# Patient Record
Sex: Male | Born: 1965
Health system: Southern US, Community
[De-identification: ages and names within clinical notes are randomized; demographics above are authoritative.]

---

## 2005-04-02 ENCOUNTER — Ambulatory Visit: Payer: Self-pay | Admitting: Endocrinology

## 2013-11-13 ENCOUNTER — Telehealth: Payer: Self-pay | Admitting: Internal Medicine

## 2013-11-13 NOTE — Telephone Encounter (Signed)
Rec'd from [facility] forward 3 pages to Dr. Plotnikov °

## 2019-07-17 DIAGNOSIS — M1711 Unilateral primary osteoarthritis, right knee: Secondary | ICD-10-CM | POA: Diagnosis not present

## 2019-07-24 DIAGNOSIS — M1711 Unilateral primary osteoarthritis, right knee: Secondary | ICD-10-CM | POA: Diagnosis not present

## 2019-07-31 DIAGNOSIS — M1711 Unilateral primary osteoarthritis, right knee: Secondary | ICD-10-CM | POA: Diagnosis not present

## 2019-08-07 DIAGNOSIS — R519 Headache, unspecified: Secondary | ICD-10-CM | POA: Diagnosis not present

## 2019-08-07 DIAGNOSIS — U071 COVID-19: Secondary | ICD-10-CM | POA: Diagnosis not present

## 2019-08-12 DIAGNOSIS — J80 Acute respiratory distress syndrome: Secondary | ICD-10-CM | POA: Diagnosis not present

## 2019-08-12 DIAGNOSIS — I4891 Unspecified atrial fibrillation: Secondary | ICD-10-CM | POA: Diagnosis not present

## 2019-08-12 DIAGNOSIS — U071 COVID-19: Secondary | ICD-10-CM | POA: Diagnosis not present

## 2019-08-12 DIAGNOSIS — R55 Syncope and collapse: Secondary | ICD-10-CM | POA: Diagnosis not present

## 2019-08-12 DIAGNOSIS — R739 Hyperglycemia, unspecified: Secondary | ICD-10-CM | POA: Diagnosis not present

## 2019-08-12 DIAGNOSIS — K5900 Constipation, unspecified: Secondary | ICD-10-CM | POA: Diagnosis not present

## 2019-08-12 DIAGNOSIS — R918 Other nonspecific abnormal finding of lung field: Secondary | ICD-10-CM | POA: Diagnosis not present

## 2019-08-12 DIAGNOSIS — I10 Essential (primary) hypertension: Secondary | ICD-10-CM | POA: Diagnosis not present

## 2019-08-12 DIAGNOSIS — R0602 Shortness of breath: Secondary | ICD-10-CM | POA: Diagnosis not present

## 2019-08-12 DIAGNOSIS — R0603 Acute respiratory distress: Secondary | ICD-10-CM | POA: Diagnosis not present

## 2019-08-12 DIAGNOSIS — E871 Hypo-osmolality and hyponatremia: Secondary | ICD-10-CM | POA: Diagnosis not present

## 2019-08-12 DIAGNOSIS — E878 Other disorders of electrolyte and fluid balance, not elsewhere classified: Secondary | ICD-10-CM | POA: Diagnosis not present

## 2019-08-12 DIAGNOSIS — G4733 Obstructive sleep apnea (adult) (pediatric): Secondary | ICD-10-CM | POA: Diagnosis not present

## 2019-08-12 DIAGNOSIS — M1990 Unspecified osteoarthritis, unspecified site: Secondary | ICD-10-CM | POA: Diagnosis not present

## 2019-08-12 DIAGNOSIS — Z6841 Body Mass Index (BMI) 40.0 and over, adult: Secondary | ICD-10-CM | POA: Diagnosis not present

## 2019-08-12 DIAGNOSIS — J9601 Acute respiratory failure with hypoxia: Secondary | ICD-10-CM | POA: Diagnosis not present

## 2019-08-12 DIAGNOSIS — I517 Cardiomegaly: Secondary | ICD-10-CM | POA: Diagnosis not present

## 2019-08-12 DIAGNOSIS — J1282 Pneumonia due to coronavirus disease 2019: Secondary | ICD-10-CM | POA: Diagnosis not present

## 2019-08-12 DIAGNOSIS — M6281 Muscle weakness (generalized): Secondary | ICD-10-CM | POA: Diagnosis not present

## 2019-08-12 DIAGNOSIS — J189 Pneumonia, unspecified organism: Secondary | ICD-10-CM | POA: Diagnosis not present

## 2019-08-13 DIAGNOSIS — J9601 Acute respiratory failure with hypoxia: Secondary | ICD-10-CM | POA: Diagnosis not present

## 2019-08-15 DIAGNOSIS — R0603 Acute respiratory distress: Secondary | ICD-10-CM | POA: Diagnosis not present

## 2019-08-15 DIAGNOSIS — R918 Other nonspecific abnormal finding of lung field: Secondary | ICD-10-CM | POA: Diagnosis not present

## 2019-08-15 DIAGNOSIS — J9601 Acute respiratory failure with hypoxia: Secondary | ICD-10-CM | POA: Diagnosis not present

## 2019-08-20 DIAGNOSIS — I517 Cardiomegaly: Secondary | ICD-10-CM | POA: Diagnosis not present

## 2019-08-20 DIAGNOSIS — J189 Pneumonia, unspecified organism: Secondary | ICD-10-CM | POA: Diagnosis not present

## 2019-08-20 DIAGNOSIS — J9601 Acute respiratory failure with hypoxia: Secondary | ICD-10-CM | POA: Diagnosis not present

## 2019-08-22 DIAGNOSIS — J9601 Acute respiratory failure with hypoxia: Secondary | ICD-10-CM | POA: Diagnosis not present

## 2019-08-22 DIAGNOSIS — U071 COVID-19: Secondary | ICD-10-CM | POA: Diagnosis not present

## 2019-08-22 DIAGNOSIS — J1282 Pneumonia due to coronavirus disease 2019: Secondary | ICD-10-CM | POA: Diagnosis not present

## 2019-08-23 DIAGNOSIS — J9601 Acute respiratory failure with hypoxia: Secondary | ICD-10-CM | POA: Diagnosis not present

## 2019-08-24 DIAGNOSIS — J9601 Acute respiratory failure with hypoxia: Secondary | ICD-10-CM | POA: Diagnosis not present

## 2019-08-24 DIAGNOSIS — K5900 Constipation, unspecified: Secondary | ICD-10-CM | POA: Diagnosis not present

## 2019-08-25 DIAGNOSIS — J9601 Acute respiratory failure with hypoxia: Secondary | ICD-10-CM | POA: Diagnosis not present

## 2019-08-26 DIAGNOSIS — J9601 Acute respiratory failure with hypoxia: Secondary | ICD-10-CM | POA: Diagnosis not present

## 2019-08-27 DIAGNOSIS — J9601 Acute respiratory failure with hypoxia: Secondary | ICD-10-CM | POA: Diagnosis not present

## 2019-08-28 DIAGNOSIS — J9601 Acute respiratory failure with hypoxia: Secondary | ICD-10-CM | POA: Diagnosis not present

## 2019-08-30 DIAGNOSIS — J9601 Acute respiratory failure with hypoxia: Secondary | ICD-10-CM | POA: Diagnosis not present

## 2019-09-01 DIAGNOSIS — M1990 Unspecified osteoarthritis, unspecified site: Secondary | ICD-10-CM | POA: Diagnosis not present

## 2019-09-01 DIAGNOSIS — M6281 Muscle weakness (generalized): Secondary | ICD-10-CM | POA: Diagnosis not present

## 2019-09-01 DIAGNOSIS — I10 Essential (primary) hypertension: Secondary | ICD-10-CM | POA: Diagnosis not present

## 2019-09-01 DIAGNOSIS — Z8616 Personal history of COVID-19: Secondary | ICD-10-CM | POA: Diagnosis not present

## 2019-09-01 DIAGNOSIS — U071 COVID-19: Secondary | ICD-10-CM | POA: Diagnosis not present

## 2019-09-01 DIAGNOSIS — J9601 Acute respiratory failure with hypoxia: Secondary | ICD-10-CM | POA: Diagnosis not present

## 2019-09-01 DIAGNOSIS — R0602 Shortness of breath: Secondary | ICD-10-CM | POA: Diagnosis not present

## 2019-09-01 DIAGNOSIS — E871 Hypo-osmolality and hyponatremia: Secondary | ICD-10-CM | POA: Diagnosis not present

## 2019-09-05 DIAGNOSIS — M1990 Unspecified osteoarthritis, unspecified site: Secondary | ICD-10-CM | POA: Diagnosis not present

## 2019-09-05 DIAGNOSIS — I10 Essential (primary) hypertension: Secondary | ICD-10-CM | POA: Diagnosis not present

## 2019-09-05 DIAGNOSIS — M6281 Muscle weakness (generalized): Secondary | ICD-10-CM | POA: Diagnosis not present

## 2019-09-06 DIAGNOSIS — I10 Essential (primary) hypertension: Secondary | ICD-10-CM | POA: Diagnosis not present

## 2019-09-06 DIAGNOSIS — M1990 Unspecified osteoarthritis, unspecified site: Secondary | ICD-10-CM | POA: Diagnosis not present

## 2019-09-12 DIAGNOSIS — J1282 Pneumonia due to coronavirus disease 2019: Secondary | ICD-10-CM | POA: Diagnosis not present

## 2019-09-12 DIAGNOSIS — J9601 Acute respiratory failure with hypoxia: Secondary | ICD-10-CM | POA: Diagnosis not present

## 2019-09-12 DIAGNOSIS — I4891 Unspecified atrial fibrillation: Secondary | ICD-10-CM | POA: Diagnosis not present

## 2019-09-12 DIAGNOSIS — U071 COVID-19: Secondary | ICD-10-CM | POA: Diagnosis not present

## 2019-09-12 DIAGNOSIS — Z6841 Body Mass Index (BMI) 40.0 and over, adult: Secondary | ICD-10-CM | POA: Diagnosis not present

## 2019-09-12 DIAGNOSIS — E871 Hypo-osmolality and hyponatremia: Secondary | ICD-10-CM | POA: Diagnosis not present

## 2019-09-13 DIAGNOSIS — E871 Hypo-osmolality and hyponatremia: Secondary | ICD-10-CM | POA: Diagnosis not present

## 2019-09-13 DIAGNOSIS — J9601 Acute respiratory failure with hypoxia: Secondary | ICD-10-CM | POA: Diagnosis not present

## 2019-09-13 DIAGNOSIS — U071 COVID-19: Secondary | ICD-10-CM | POA: Diagnosis not present

## 2019-09-13 DIAGNOSIS — J1282 Pneumonia due to coronavirus disease 2019: Secondary | ICD-10-CM | POA: Diagnosis not present

## 2019-09-13 DIAGNOSIS — Z6841 Body Mass Index (BMI) 40.0 and over, adult: Secondary | ICD-10-CM | POA: Diagnosis not present

## 2019-09-13 DIAGNOSIS — I4891 Unspecified atrial fibrillation: Secondary | ICD-10-CM | POA: Diagnosis not present

## 2019-09-19 DIAGNOSIS — J9601 Acute respiratory failure with hypoxia: Secondary | ICD-10-CM | POA: Diagnosis not present

## 2019-09-19 DIAGNOSIS — E871 Hypo-osmolality and hyponatremia: Secondary | ICD-10-CM | POA: Diagnosis not present

## 2019-09-19 DIAGNOSIS — U071 COVID-19: Secondary | ICD-10-CM | POA: Diagnosis not present

## 2019-09-19 DIAGNOSIS — J1282 Pneumonia due to coronavirus disease 2019: Secondary | ICD-10-CM | POA: Diagnosis not present

## 2019-09-19 DIAGNOSIS — Z6841 Body Mass Index (BMI) 40.0 and over, adult: Secondary | ICD-10-CM | POA: Diagnosis not present

## 2019-09-19 DIAGNOSIS — I4891 Unspecified atrial fibrillation: Secondary | ICD-10-CM | POA: Diagnosis not present

## 2019-09-20 DIAGNOSIS — E871 Hypo-osmolality and hyponatremia: Secondary | ICD-10-CM | POA: Diagnosis not present

## 2019-09-20 DIAGNOSIS — Z6841 Body Mass Index (BMI) 40.0 and over, adult: Secondary | ICD-10-CM | POA: Diagnosis not present

## 2019-09-20 DIAGNOSIS — U071 COVID-19: Secondary | ICD-10-CM | POA: Diagnosis not present

## 2019-09-20 DIAGNOSIS — I4891 Unspecified atrial fibrillation: Secondary | ICD-10-CM | POA: Diagnosis not present

## 2019-09-20 DIAGNOSIS — J9601 Acute respiratory failure with hypoxia: Secondary | ICD-10-CM | POA: Diagnosis not present

## 2019-09-20 DIAGNOSIS — J1282 Pneumonia due to coronavirus disease 2019: Secondary | ICD-10-CM | POA: Diagnosis not present

## 2019-09-24 DIAGNOSIS — I4891 Unspecified atrial fibrillation: Secondary | ICD-10-CM | POA: Diagnosis not present

## 2019-09-24 DIAGNOSIS — Z6841 Body Mass Index (BMI) 40.0 and over, adult: Secondary | ICD-10-CM | POA: Diagnosis not present

## 2019-09-24 DIAGNOSIS — J9601 Acute respiratory failure with hypoxia: Secondary | ICD-10-CM | POA: Diagnosis not present

## 2019-09-24 DIAGNOSIS — E871 Hypo-osmolality and hyponatremia: Secondary | ICD-10-CM | POA: Diagnosis not present

## 2019-09-24 DIAGNOSIS — U071 COVID-19: Secondary | ICD-10-CM | POA: Diagnosis not present

## 2019-09-24 DIAGNOSIS — J1282 Pneumonia due to coronavirus disease 2019: Secondary | ICD-10-CM | POA: Diagnosis not present

## 2019-10-02 DIAGNOSIS — Z8616 Personal history of COVID-19: Secondary | ICD-10-CM | POA: Diagnosis not present

## 2019-10-02 DIAGNOSIS — R0602 Shortness of breath: Secondary | ICD-10-CM | POA: Diagnosis not present

## 2019-10-02 DIAGNOSIS — U071 COVID-19: Secondary | ICD-10-CM | POA: Diagnosis not present

## 2019-10-03 DIAGNOSIS — J9601 Acute respiratory failure with hypoxia: Secondary | ICD-10-CM | POA: Diagnosis not present

## 2019-10-03 DIAGNOSIS — Z6841 Body Mass Index (BMI) 40.0 and over, adult: Secondary | ICD-10-CM | POA: Diagnosis not present

## 2019-10-03 DIAGNOSIS — I4891 Unspecified atrial fibrillation: Secondary | ICD-10-CM | POA: Diagnosis not present

## 2019-10-03 DIAGNOSIS — U071 COVID-19: Secondary | ICD-10-CM | POA: Diagnosis not present

## 2019-10-03 DIAGNOSIS — E871 Hypo-osmolality and hyponatremia: Secondary | ICD-10-CM | POA: Diagnosis not present

## 2019-10-03 DIAGNOSIS — J1282 Pneumonia due to coronavirus disease 2019: Secondary | ICD-10-CM | POA: Diagnosis not present

## 2019-10-05 DIAGNOSIS — J9601 Acute respiratory failure with hypoxia: Secondary | ICD-10-CM | POA: Diagnosis not present

## 2019-10-05 DIAGNOSIS — J1282 Pneumonia due to coronavirus disease 2019: Secondary | ICD-10-CM | POA: Diagnosis not present

## 2019-10-05 DIAGNOSIS — E871 Hypo-osmolality and hyponatremia: Secondary | ICD-10-CM | POA: Diagnosis not present

## 2019-10-05 DIAGNOSIS — U071 COVID-19: Secondary | ICD-10-CM | POA: Diagnosis not present

## 2019-10-05 DIAGNOSIS — I4891 Unspecified atrial fibrillation: Secondary | ICD-10-CM | POA: Diagnosis not present

## 2019-10-05 DIAGNOSIS — Z6841 Body Mass Index (BMI) 40.0 and over, adult: Secondary | ICD-10-CM | POA: Diagnosis not present

## 2019-10-11 DIAGNOSIS — I4891 Unspecified atrial fibrillation: Secondary | ICD-10-CM | POA: Diagnosis not present

## 2019-10-11 DIAGNOSIS — J9601 Acute respiratory failure with hypoxia: Secondary | ICD-10-CM | POA: Diagnosis not present

## 2019-10-11 DIAGNOSIS — Z6841 Body Mass Index (BMI) 40.0 and over, adult: Secondary | ICD-10-CM | POA: Diagnosis not present

## 2019-10-11 DIAGNOSIS — U071 COVID-19: Secondary | ICD-10-CM | POA: Diagnosis not present

## 2019-10-11 DIAGNOSIS — J1282 Pneumonia due to coronavirus disease 2019: Secondary | ICD-10-CM | POA: Diagnosis not present

## 2019-10-11 DIAGNOSIS — E871 Hypo-osmolality and hyponatremia: Secondary | ICD-10-CM | POA: Diagnosis not present

## 2019-10-17 DIAGNOSIS — M1990 Unspecified osteoarthritis, unspecified site: Secondary | ICD-10-CM | POA: Diagnosis not present

## 2019-10-17 DIAGNOSIS — G4733 Obstructive sleep apnea (adult) (pediatric): Secondary | ICD-10-CM | POA: Diagnosis not present

## 2019-10-17 DIAGNOSIS — M6281 Muscle weakness (generalized): Secondary | ICD-10-CM | POA: Diagnosis not present

## 2019-10-17 DIAGNOSIS — Z9981 Dependence on supplemental oxygen: Secondary | ICD-10-CM | POA: Diagnosis not present

## 2019-10-19 DIAGNOSIS — G4730 Sleep apnea, unspecified: Secondary | ICD-10-CM | POA: Diagnosis not present

## 2019-10-23 DIAGNOSIS — I340 Nonrheumatic mitral (valve) insufficiency: Secondary | ICD-10-CM | POA: Diagnosis not present

## 2019-10-23 DIAGNOSIS — I517 Cardiomegaly: Secondary | ICD-10-CM | POA: Diagnosis not present

## 2019-10-23 DIAGNOSIS — U071 COVID-19: Secondary | ICD-10-CM | POA: Diagnosis not present

## 2019-10-23 DIAGNOSIS — J1282 Pneumonia due to coronavirus disease 2019: Secondary | ICD-10-CM | POA: Diagnosis not present

## 2019-10-24 ENCOUNTER — Ambulatory Visit: Payer: BC Managed Care – PPO | Admitting: Pulmonary Disease

## 2019-10-24 ENCOUNTER — Ambulatory Visit (INDEPENDENT_AMBULATORY_CARE_PROVIDER_SITE_OTHER)
Admission: RE | Admit: 2019-10-24 | Discharge: 2019-10-24 | Disposition: A | Discharge status: Home / Self Care | Payer: BC Managed Care – PPO | Source: Ambulatory Visit | Attending: Pulmonary Disease | Admitting: Pulmonary Disease

## 2019-10-24 ENCOUNTER — Ambulatory Visit: Payer: BC Managed Care – PPO

## 2019-10-24 ENCOUNTER — Other Ambulatory Visit: Payer: Self-pay

## 2019-10-24 ENCOUNTER — Encounter: Payer: Self-pay | Admitting: Pulmonary Disease

## 2019-10-24 VITALS — BP 124/68 | HR 86 | Temp 97.1°F | Ht 73.0 in | Wt 377.8 lb

## 2019-10-24 DIAGNOSIS — R0602 Shortness of breath: Secondary | ICD-10-CM | POA: Diagnosis not present

## 2019-10-24 DIAGNOSIS — R0600 Dyspnea, unspecified: Secondary | ICD-10-CM | POA: Diagnosis not present

## 2019-10-24 DIAGNOSIS — R0609 Other forms of dyspnea: Secondary | ICD-10-CM

## 2019-10-24 NOTE — Progress Notes (Signed)
Travis Stevenson    [id number]    Nov 01, 1965  Primary Care Physician:Dang, Nyoka Lint, MD  Referring Physician: No referring provider defined for this encounter.  Chief complaint:   Patient was treated recently treated in the hospital for Covid pneumonia  HPI:  Recent treatment for Covid pneumonia was admitted February 14, spent 3 weeks in the hospital Was not intubated He was discharged on 3 L of oxygen currently down to 1 L Exercise tolerance has continued to improve Saturations usually about 9091% on room air at rest with activity he does use about 1 L with saturations in the low 90s  Denies any shortness of breath at rest Able to tolerate activities of daily living  He did not have any lung disease or heart disease prior to his diagnosis of Covid pneumonia  He remembers being told that he has extensive infiltrative process in his lungs even at the time of discharge  He does have a history of snoring Denies headaches denies any dryness of his mouth in the mornings No family history of obstructive sleep apnea As always.  Heavy  Denies any significant daytime sleepiness  Never smoker  Pets: Occupation: Exposures: Smoking history: Travel history: Relevant family history:  Outpatient Encounter Medications as of 10/24/2019  Medication Sig  . aspirin EC 81 MG tablet Take 81 mg by mouth daily.  . metoprolol tartrate (LOPRESSOR) 50 MG tablet Take 50 mg by mouth 2 (two) times daily.   No facility-administered encounter medications on file as of 10/24/2019.    Allergies as of 10/24/2019  . (No Known Allergies)    No past medical history on file.   No family history on file.  Social History   Socioeconomic History  . Marital status: Married    Spouse name: Not on file  . Number of children: Not on file  . Years of education: Not on file  . Highest education level: Not on file  Occupational History  . Not on file  Tobacco Use  . Smoking status: Never  Smoker  . Smokeless tobacco: Never Used  Substance and Sexual Activity  . Alcohol use: Not on file  . Drug use: Not on file  . Sexual activity: Not on file  Other Topics Concern  . Not on file  Social History Narrative  . Not on file   Social Determinants of Health   Financial Resource Strain:   . Difficulty of Paying Living Expenses:   Food Insecurity:   . Worried About Charity fundraiser in the Last Year:   . Arboriculturist in the Last Year:   Transportation Needs:   . Film/video editor (Medical):   Marland Kitchen Lack of Transportation (Non-Medical):   Physical Activity:   . Days of Exercise per Week:   . Minutes of Exercise per Session:   Stress:   . Feeling of Stress :   Social Connections:   . Frequency of Communication with Friends and Family:   . Frequency of Social Gatherings with Friends and Family:   . Attends Religious Services:   . Active Member of Clubs or Organizations:   . Attends Archivist Meetings:   Marland Kitchen Marital Status:   Intimate Partner Violence:   . Fear of Current or Ex-Partner:   . Emotionally Abused:   Marland Kitchen Physically Abused:   . Sexually Abused:     Review of Systems  Respiratory: Positive for shortness of breath. Negative for cough.  Psychiatric/Behavioral: Positive for sleep disturbance.    Vitals:   10/24/19 1021  BP: 124/68  Pulse: 86  Temp: (!) 97.1 F (36.2 C)  SpO2: 97%     Physical Exam  Constitutional: He is oriented to person, place, and time. He appears well-developed.  Obese  HENT:  Head: Normocephalic and atraumatic.  Eyes: Pupils are equal, round, and reactive to light. Right eye exhibits no discharge. Left eye exhibits no discharge.  Neck: No tracheal deviation present. No thyromegaly present.  Cardiovascular: Normal rate and regular rhythm.  Pulmonary/Chest: Effort normal and breath sounds normal. No respiratory distress. He has no wheezes. He has no rales. He exhibits no tenderness.  Musculoskeletal:         General: No edema. Normal range of motion.     Cervical back: Normal range of motion and neck supple.  Neurological: He is alert and oriented to person, place, and time.  Skin: Skin is warm and dry.  Psychiatric: He has a normal mood and affect.   Data Reviewed: CT on record at [facility] August 12, 2019 revealed extensive bilateral groundglass and consolidative airspace disease  Assessment:  Hypoxemic respiratory failure -Continue using oxygen supplementation Wean oxygen as tolerated  Graded exercises  Possible obstructive sleep apnea -Consider home sleep study when more stable  Plan/Recommendations: Obtain chest x-ray today  Wean oxygen as tolerated  Obtain pulmonary function testing  Consider sleep study  Weight loss efforts  We will follow-up with him in about 4 to 6 weeks   Virl Diamond MD [facility] 10/24/2019, 10:54 AM  CC: No ref. provider found

## 2019-10-24 NOTE — Patient Instructions (Signed)
Recent Covid pneumonia Hypoxic respiratory failure-need for oxygen  Continue graded activities and exercise Oxygen use as needed to keep saturations greater than 90%  We will get a chest x-ray today We will order a breathing study on you  We may get a CT scan of the chest in a few weeks down the line depending on what the x-ray shows  A sleep study once things improve a little bit better  Call with significant concerns

## 2019-11-01 DIAGNOSIS — U071 COVID-19: Secondary | ICD-10-CM | POA: Diagnosis not present

## 2019-11-06 ENCOUNTER — Telehealth: Payer: Self-pay | Admitting: Pulmonary Disease

## 2019-11-06 NOTE — Telephone Encounter (Signed)
Dr. Wynona Neat, please review chest xray.

## 2019-11-08 NOTE — Telephone Encounter (Signed)
Dr. Wynona Neat please advise on CXR

## 2019-11-16 ENCOUNTER — Other Ambulatory Visit (HOSPITAL_COMMUNITY)
Admission: RE | Admit: 2019-11-16 | Discharge: 2019-11-16 | Disposition: A | Discharge status: Home / Self Care | Payer: BC Managed Care – PPO | Source: Ambulatory Visit | Attending: Pulmonary Disease | Admitting: Pulmonary Disease

## 2019-11-16 DIAGNOSIS — Z01812 Encounter for preprocedural laboratory examination: Secondary | ICD-10-CM | POA: Insufficient documentation

## 2019-11-16 DIAGNOSIS — Z20822 Contact with and (suspected) exposure to covid-19: Secondary | ICD-10-CM | POA: Diagnosis not present

## 2019-11-16 LAB — SARS CORONAVIRUS 2 (TAT 6-24 HRS): SARS Coronavirus 2: NEGATIVE

## 2019-11-19 NOTE — Telephone Encounter (Signed)
Scarring in the right midlung-chronic change  No acute process

## 2019-11-20 ENCOUNTER — Other Ambulatory Visit: Payer: Self-pay

## 2019-11-20 ENCOUNTER — Ambulatory Visit (INDEPENDENT_AMBULATORY_CARE_PROVIDER_SITE_OTHER): Payer: BC Managed Care – PPO | Admitting: Pulmonary Disease

## 2019-11-20 DIAGNOSIS — R0600 Dyspnea, unspecified: Secondary | ICD-10-CM | POA: Diagnosis not present

## 2019-11-20 DIAGNOSIS — R0609 Other forms of dyspnea: Secondary | ICD-10-CM

## 2019-11-20 LAB — PULMONARY FUNCTION TEST
DL/VA % pred: 103 %
DL/VA: 4.48 ml/min/mmHg/L
DLCO cor % pred: 82 %
DLCO cor: 24.37 ml/min/mmHg
DLCO unc % pred: 82 %
DLCO unc: 24.37 ml/min/mmHg
FEF 25-75 Post: 5.07 L/sec
FEF 25-75 Pre: 3.81 L/sec
FEF2575-%Change-Post: 32 %
FEF2575-%Pred-Post: 152 %
FEF2575-%Pred-Pre: 114 %
FEV1-%Change-Post: 6 %
FEV1-%Pred-Post: 87 %
FEV1-%Pred-Pre: 82 %
FEV1-Post: 3.4 L
FEV1-Pre: 3.2 L
FEV1FVC-%Change-Post: 3 %
FEV1FVC-%Pred-Pre: 110 %
FEV6-%Change-Post: 2 %
FEV6-%Pred-Post: 78 %
FEV6-%Pred-Pre: 77 %
FEV6-Post: 3.85 L
FEV6-Pre: 3.77 L
FEV6FVC-%Change-Post: 0 %
FEV6FVC-%Pred-Post: 103 %
FEV6FVC-%Pred-Pre: 103 %
FVC-%Change-Post: 2 %
FVC-%Pred-Post: 76 %
FVC-%Pred-Pre: 74 %
FVC-Post: 3.88 L
FVC-Pre: 3.79 L
Post FEV1/FVC ratio: 88 %
Post FEV6/FVC ratio: 100 %
Pre FEV1/FVC ratio: 85 %
Pre FEV6/FVC Ratio: 99 %
RV % pred: -28 %
RV: -0.62 L
TLC % pred: 50 %
TLC: 3.6 L

## 2019-11-20 NOTE — Progress Notes (Signed)
PFT done today. 

## 2019-11-20 NOTE — Telephone Encounter (Signed)
Spoke with pt. He is aware of results. Nothing further was needed. 

## 2020-01-08 DIAGNOSIS — U071 COVID-19: Secondary | ICD-10-CM | POA: Diagnosis not present

## 2020-01-08 DIAGNOSIS — J1282 Pneumonia due to coronavirus disease 2019: Secondary | ICD-10-CM | POA: Diagnosis not present

## 2020-02-04 DIAGNOSIS — L814 Other melanin hyperpigmentation: Secondary | ICD-10-CM | POA: Diagnosis not present

## 2020-02-04 DIAGNOSIS — L821 Other seborrheic keratosis: Secondary | ICD-10-CM | POA: Diagnosis not present

## 2020-02-04 DIAGNOSIS — D229 Melanocytic nevi, unspecified: Secondary | ICD-10-CM | POA: Diagnosis not present

## 2020-02-04 DIAGNOSIS — L570 Actinic keratosis: Secondary | ICD-10-CM | POA: Diagnosis not present

## 2020-02-04 DIAGNOSIS — L819 Disorder of pigmentation, unspecified: Secondary | ICD-10-CM | POA: Diagnosis not present

## 2020-07-10 DIAGNOSIS — J1282 Pneumonia due to coronavirus disease 2019: Secondary | ICD-10-CM | POA: Diagnosis not present

## 2020-07-10 DIAGNOSIS — U071 COVID-19: Secondary | ICD-10-CM | POA: Diagnosis not present

## 2020-08-06 DIAGNOSIS — M47816 Spondylosis without myelopathy or radiculopathy, lumbar region: Secondary | ICD-10-CM | POA: Diagnosis not present

## 2020-08-06 DIAGNOSIS — Z79899 Other long term (current) drug therapy: Secondary | ICD-10-CM | POA: Diagnosis not present

## 2020-08-06 DIAGNOSIS — M5137 Other intervertebral disc degeneration, lumbosacral region: Secondary | ICD-10-CM | POA: Diagnosis not present

## 2020-08-06 DIAGNOSIS — M5136 Other intervertebral disc degeneration, lumbar region: Secondary | ICD-10-CM | POA: Diagnosis not present

## 2020-08-06 DIAGNOSIS — M4316 Spondylolisthesis, lumbar region: Secondary | ICD-10-CM | POA: Diagnosis not present

## 2020-08-06 DIAGNOSIS — G8911 Acute pain due to trauma: Secondary | ICD-10-CM | POA: Diagnosis not present

## 2020-08-06 DIAGNOSIS — Z7982 Long term (current) use of aspirin: Secondary | ICD-10-CM | POA: Diagnosis not present

## 2020-08-06 DIAGNOSIS — M545 Low back pain, unspecified: Secondary | ICD-10-CM | POA: Diagnosis not present

## 2020-09-16 DIAGNOSIS — M1711 Unilateral primary osteoarthritis, right knee: Secondary | ICD-10-CM | POA: Diagnosis not present

## 2020-09-23 DIAGNOSIS — M1711 Unilateral primary osteoarthritis, right knee: Secondary | ICD-10-CM | POA: Diagnosis not present

## 2020-09-30 DIAGNOSIS — M1711 Unilateral primary osteoarthritis, right knee: Secondary | ICD-10-CM | POA: Diagnosis not present

## 2020-12-12 DIAGNOSIS — H66002 Acute suppurative otitis media without spontaneous rupture of ear drum, left ear: Secondary | ICD-10-CM | POA: Diagnosis not present

## 2020-12-12 DIAGNOSIS — R030 Elevated blood-pressure reading, without diagnosis of hypertension: Secondary | ICD-10-CM | POA: Diagnosis not present

## 2020-12-12 DIAGNOSIS — Z20822 Contact with and (suspected) exposure to covid-19: Secondary | ICD-10-CM | POA: Diagnosis not present

## 2020-12-12 DIAGNOSIS — Z03818 Encounter for observation for suspected exposure to other biological agents ruled out: Secondary | ICD-10-CM | POA: Diagnosis not present

## 2021-01-07 DIAGNOSIS — J9601 Acute respiratory failure with hypoxia: Secondary | ICD-10-CM | POA: Diagnosis not present

## 2021-01-20 IMAGING — DX DG CHEST 2V
2 series · 2 of 2 positions shown · non-contrast
Comparison: None.

CLINICAL DATA: Post COVID, shortness of breath

EXAM:
CHEST - 2 VIEW

[chest pa]
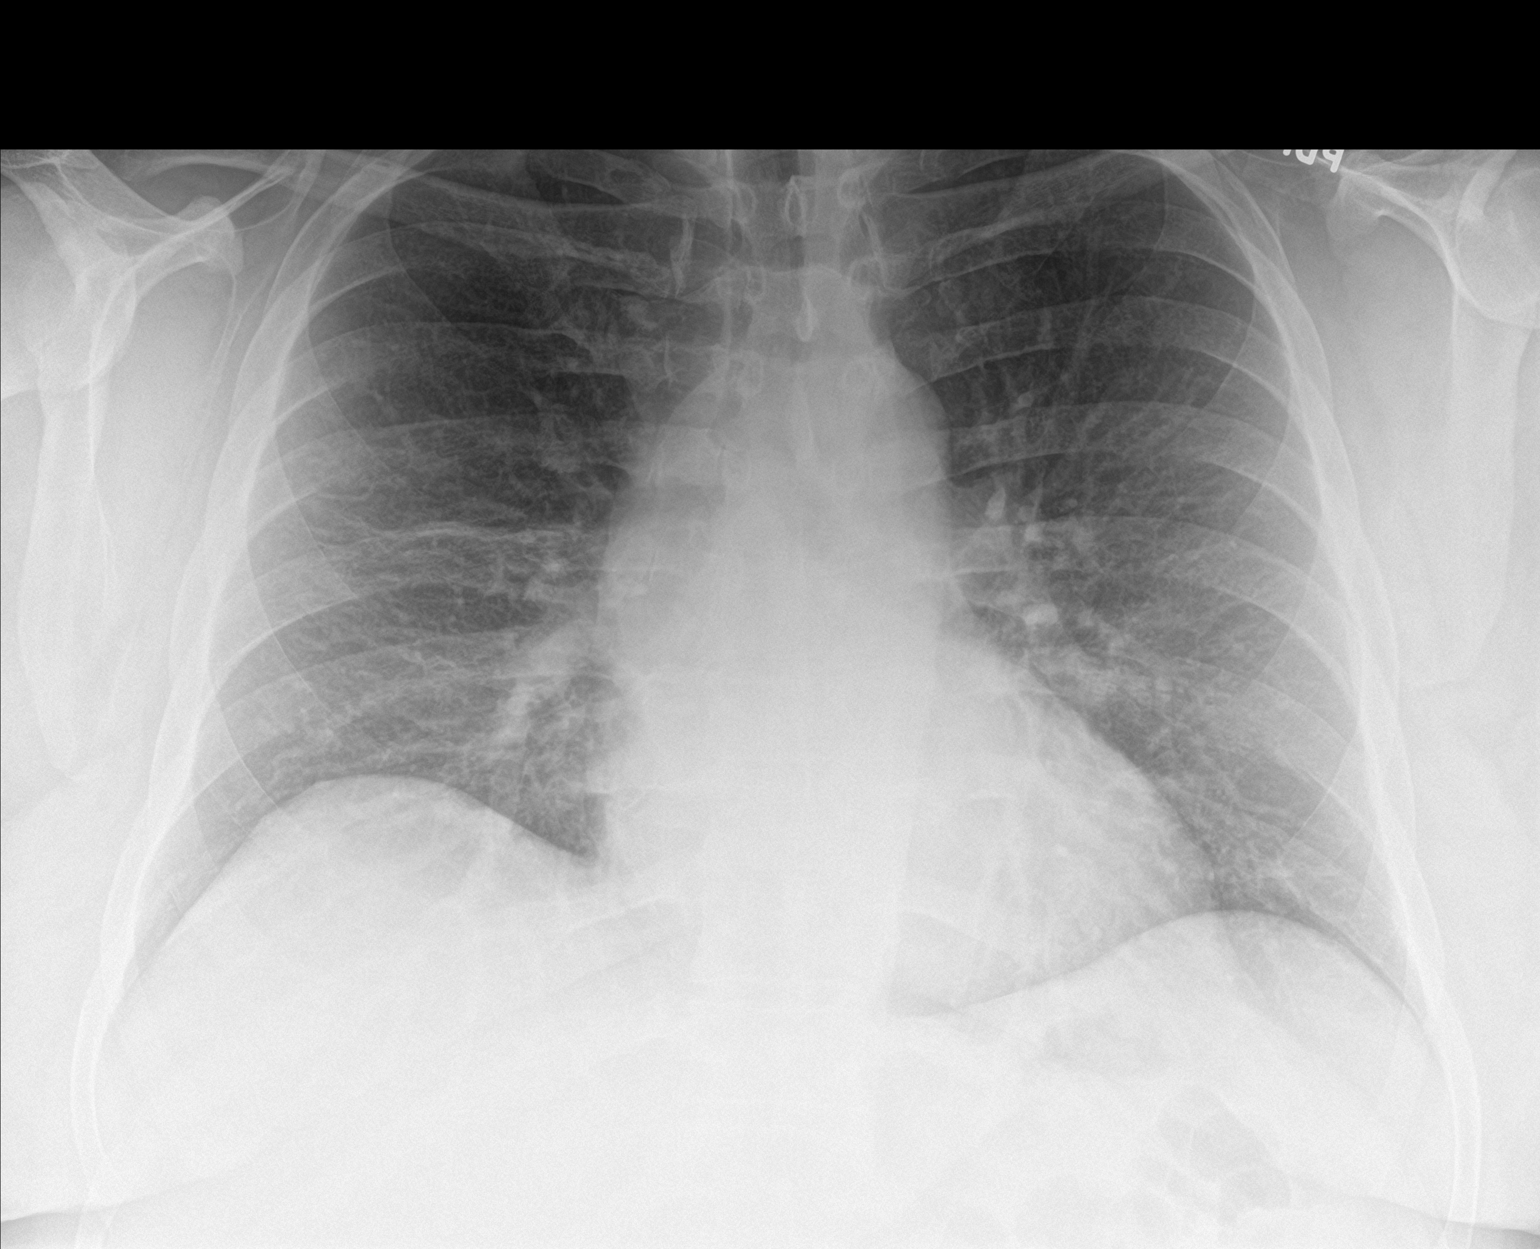

[chest lat]
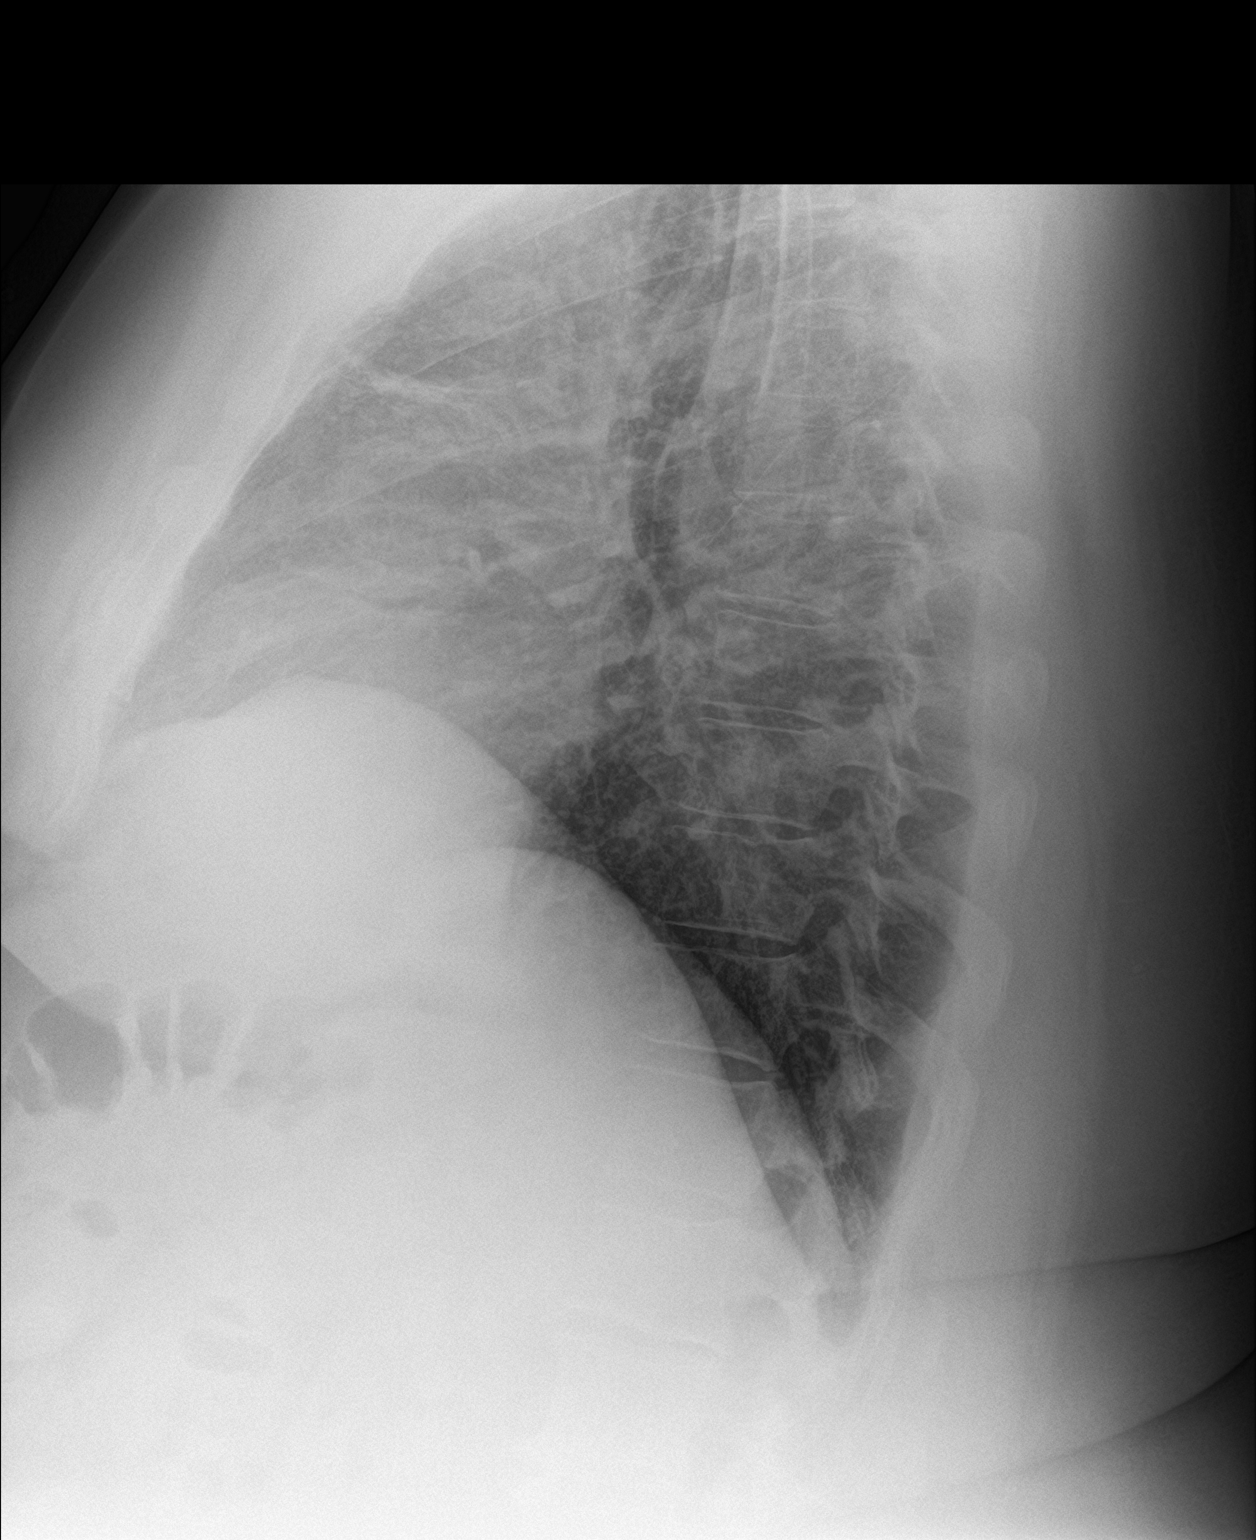

[2 of 2 positions shown; findings below may reference images not displayed]

FINDINGS: Heart is normal size. Linear atelectasis or scarring in the right
mid lung. Left lung clear. No effusions or pneumothorax. No acute
bony abnormality.
IMPRESSION: Right mid lung scarring or atelectasis.  No active disease.

## 2021-01-27 DIAGNOSIS — M25561 Pain in right knee: Secondary | ICD-10-CM | POA: Diagnosis not present

## 2021-01-27 DIAGNOSIS — M1711 Unilateral primary osteoarthritis, right knee: Secondary | ICD-10-CM | POA: Diagnosis not present

## 2021-01-27 DIAGNOSIS — G8929 Other chronic pain: Secondary | ICD-10-CM | POA: Diagnosis not present

## 2021-02-03 DIAGNOSIS — L821 Other seborrheic keratosis: Secondary | ICD-10-CM | POA: Diagnosis not present

## 2021-02-03 DIAGNOSIS — D229 Melanocytic nevi, unspecified: Secondary | ICD-10-CM | POA: Diagnosis not present

## 2021-02-03 DIAGNOSIS — L718 Other rosacea: Secondary | ICD-10-CM | POA: Diagnosis not present

## 2021-02-03 DIAGNOSIS — L570 Actinic keratosis: Secondary | ICD-10-CM | POA: Diagnosis not present

## 2021-02-03 DIAGNOSIS — L819 Disorder of pigmentation, unspecified: Secondary | ICD-10-CM | POA: Diagnosis not present

## 2021-03-17 DIAGNOSIS — M9901 Segmental and somatic dysfunction of cervical region: Secondary | ICD-10-CM | POA: Diagnosis not present

## 2021-03-17 DIAGNOSIS — M25512 Pain in left shoulder: Secondary | ICD-10-CM | POA: Diagnosis not present

## 2021-03-19 DIAGNOSIS — M9901 Segmental and somatic dysfunction of cervical region: Secondary | ICD-10-CM | POA: Diagnosis not present

## 2021-03-19 DIAGNOSIS — M25512 Pain in left shoulder: Secondary | ICD-10-CM | POA: Diagnosis not present

## 2021-03-23 DIAGNOSIS — M9901 Segmental and somatic dysfunction of cervical region: Secondary | ICD-10-CM | POA: Diagnosis not present

## 2021-03-23 DIAGNOSIS — M25512 Pain in left shoulder: Secondary | ICD-10-CM | POA: Diagnosis not present

## 2021-03-26 DIAGNOSIS — M9901 Segmental and somatic dysfunction of cervical region: Secondary | ICD-10-CM | POA: Diagnosis not present

## 2021-03-26 DIAGNOSIS — M25512 Pain in left shoulder: Secondary | ICD-10-CM | POA: Diagnosis not present

## 2021-08-24 DIAGNOSIS — M7732 Calcaneal spur, left foot: Secondary | ICD-10-CM | POA: Diagnosis not present

## 2021-08-24 DIAGNOSIS — M25512 Pain in left shoulder: Secondary | ICD-10-CM | POA: Diagnosis not present

## 2021-08-24 DIAGNOSIS — M79672 Pain in left foot: Secondary | ICD-10-CM | POA: Diagnosis not present

## 2021-08-24 DIAGNOSIS — M722 Plantar fascial fibromatosis: Secondary | ICD-10-CM | POA: Diagnosis not present

## 2021-08-24 DIAGNOSIS — M9901 Segmental and somatic dysfunction of cervical region: Secondary | ICD-10-CM | POA: Diagnosis not present

## 2021-08-24 DIAGNOSIS — M71572 Other bursitis, not elsewhere classified, left ankle and foot: Secondary | ICD-10-CM | POA: Diagnosis not present

## 2021-08-31 DIAGNOSIS — M25512 Pain in left shoulder: Secondary | ICD-10-CM | POA: Diagnosis not present

## 2021-08-31 DIAGNOSIS — M9901 Segmental and somatic dysfunction of cervical region: Secondary | ICD-10-CM | POA: Diagnosis not present

## 2021-09-07 DIAGNOSIS — M71572 Other bursitis, not elsewhere classified, left ankle and foot: Secondary | ICD-10-CM | POA: Diagnosis not present

## 2021-09-07 DIAGNOSIS — M722 Plantar fascial fibromatosis: Secondary | ICD-10-CM | POA: Diagnosis not present

## 2021-09-21 DIAGNOSIS — M722 Plantar fascial fibromatosis: Secondary | ICD-10-CM | POA: Diagnosis not present

## 2021-09-21 DIAGNOSIS — M71572 Other bursitis, not elsewhere classified, left ankle and foot: Secondary | ICD-10-CM | POA: Diagnosis not present

## 2021-10-07 DIAGNOSIS — M545 Low back pain, unspecified: Secondary | ICD-10-CM | POA: Diagnosis not present

## 2021-10-07 DIAGNOSIS — M25551 Pain in right hip: Secondary | ICD-10-CM | POA: Diagnosis not present

## 2021-10-07 DIAGNOSIS — M2578 Osteophyte, vertebrae: Secondary | ICD-10-CM | POA: Diagnosis not present

## 2021-12-01 DIAGNOSIS — L247 Irritant contact dermatitis due to plants, except food: Secondary | ICD-10-CM | POA: Diagnosis not present

## 2021-12-01 DIAGNOSIS — I10 Essential (primary) hypertension: Secondary | ICD-10-CM | POA: Diagnosis not present

## 2022-01-11 DIAGNOSIS — M9901 Segmental and somatic dysfunction of cervical region: Secondary | ICD-10-CM | POA: Diagnosis not present

## 2022-01-11 DIAGNOSIS — M25512 Pain in left shoulder: Secondary | ICD-10-CM | POA: Diagnosis not present

## 2022-02-03 DIAGNOSIS — Z1211 Encounter for screening for malignant neoplasm of colon: Secondary | ICD-10-CM | POA: Diagnosis not present

## 2022-02-03 DIAGNOSIS — R7303 Prediabetes: Secondary | ICD-10-CM | POA: Diagnosis not present

## 2022-02-03 DIAGNOSIS — R609 Edema, unspecified: Secondary | ICD-10-CM | POA: Diagnosis not present

## 2022-02-03 DIAGNOSIS — Z6841 Body Mass Index (BMI) 40.0 and over, adult: Secondary | ICD-10-CM | POA: Diagnosis not present

## 2022-04-23 DIAGNOSIS — K5730 Diverticulosis of large intestine without perforation or abscess without bleeding: Secondary | ICD-10-CM | POA: Diagnosis not present

## 2022-04-23 DIAGNOSIS — Z1211 Encounter for screening for malignant neoplasm of colon: Secondary | ICD-10-CM | POA: Diagnosis not present

## 2022-04-23 DIAGNOSIS — Z79899 Other long term (current) drug therapy: Secondary | ICD-10-CM | POA: Diagnosis not present

## 2022-04-23 DIAGNOSIS — D123 Benign neoplasm of transverse colon: Secondary | ICD-10-CM | POA: Diagnosis not present

## 2022-04-23 DIAGNOSIS — D124 Benign neoplasm of descending colon: Secondary | ICD-10-CM | POA: Diagnosis not present

## 2022-04-23 DIAGNOSIS — Z7982 Long term (current) use of aspirin: Secondary | ICD-10-CM | POA: Diagnosis not present

## 2022-04-23 DIAGNOSIS — K635 Polyp of colon: Secondary | ICD-10-CM | POA: Diagnosis not present

## 2022-04-23 DIAGNOSIS — D122 Benign neoplasm of ascending colon: Secondary | ICD-10-CM | POA: Diagnosis not present
